# Patient Record
Sex: Female | Born: 1990 | Race: White | Hispanic: No | Marital: Married | Smoking: Never smoker
Health system: Southern US, Community
[De-identification: ages and names within clinical notes are randomized; demographics above are authoritative.]

## PROBLEM LIST (undated history)

## (undated) DIAGNOSIS — M419 Scoliosis, unspecified: Secondary | ICD-10-CM

## (undated) DIAGNOSIS — G43909 Migraine, unspecified, not intractable, without status migrainosus: Secondary | ICD-10-CM

## (undated) DIAGNOSIS — E079 Disorder of thyroid, unspecified: Secondary | ICD-10-CM

## (undated) DIAGNOSIS — F419 Anxiety disorder, unspecified: Secondary | ICD-10-CM

## (undated) HISTORY — PX: BACK SURGERY: SHX140

---

## 2019-04-02 ENCOUNTER — Emergency Department (HOSPITAL_BASED_OUTPATIENT_CLINIC_OR_DEPARTMENT_OTHER)

## 2019-04-02 ENCOUNTER — Other Ambulatory Visit: Payer: Self-pay

## 2019-04-02 ENCOUNTER — Emergency Department (HOSPITAL_BASED_OUTPATIENT_CLINIC_OR_DEPARTMENT_OTHER)
Admission: EM | Admit: 2019-04-02 | Discharge: 2019-04-02 | Disposition: A | Discharge status: Home / Self Care | Attending: Emergency Medicine | Admitting: Emergency Medicine

## 2019-04-02 ENCOUNTER — Encounter (HOSPITAL_BASED_OUTPATIENT_CLINIC_OR_DEPARTMENT_OTHER): Payer: Self-pay

## 2019-04-02 DIAGNOSIS — R002 Palpitations: Secondary | ICD-10-CM | POA: Diagnosis not present

## 2019-04-02 DIAGNOSIS — Z79899 Other long term (current) drug therapy: Secondary | ICD-10-CM | POA: Insufficient documentation

## 2019-04-02 HISTORY — DX: Anxiety disorder, unspecified: F41.9

## 2019-04-02 HISTORY — DX: Disorder of thyroid, unspecified: E07.9

## 2019-04-02 LAB — CBC WITH DIFFERENTIAL/PLATELET
Abs Immature Granulocytes: 0.01 10*3/uL (ref 0.00–0.07)
Basophils Absolute: 0 10*3/uL (ref 0.0–0.1)
Basophils Relative: 1 %
Eosinophils Absolute: 0.2 10*3/uL (ref 0.0–0.5)
Eosinophils Relative: 3 %
HCT: 43.6 % (ref 36.0–46.0)
Hemoglobin: 14.4 g/dL (ref 12.0–15.0)
Immature Granulocytes: 0 %
Lymphocytes Relative: 43 %
Lymphs Abs: 2.6 10*3/uL (ref 0.7–4.0)
MCH: 29 pg (ref 26.0–34.0)
MCHC: 33 g/dL (ref 30.0–36.0)
MCV: 87.7 fL (ref 80.0–100.0)
Monocytes Absolute: 0.5 10*3/uL (ref 0.1–1.0)
Monocytes Relative: 9 %
Neutro Abs: 2.7 10*3/uL (ref 1.7–7.7)
Neutrophils Relative %: 44 %
Platelets: 243 10*3/uL (ref 150–400)
RBC: 4.97 MIL/uL (ref 3.87–5.11)
RDW: 13.2 % (ref 11.5–15.5)
WBC: 6 10*3/uL (ref 4.0–10.5)
nRBC: 0 % (ref 0.0–0.2)

## 2019-04-02 LAB — COMPREHENSIVE METABOLIC PANEL
ALT: 20 U/L (ref 0–44)
AST: 21 U/L (ref 15–41)
Albumin: 4.3 g/dL (ref 3.5–5.0)
Alkaline Phosphatase: 59 U/L (ref 38–126)
Anion gap: 9 (ref 5–15)
BUN: 9 mg/dL (ref 6–20)
CO2: 22 mmol/L (ref 22–32)
Calcium: 9.1 mg/dL (ref 8.9–10.3)
Chloride: 107 mmol/L (ref 98–111)
Creatinine, Ser: 0.78 mg/dL (ref 0.44–1.00)
GFR calc Af Amer: >60 mL/min (ref >60)
GFR calc non Af Amer: >60 mL/min (ref >60)
Glucose, Bld: 96 mg/dL (ref 70–99)
Potassium: 3.4 mmol/L — ABNORMAL LOW (ref 3.5–5.1)
Sodium: 138 mmol/L (ref 135–145)
Total Bilirubin: 1.3 mg/dL — ABNORMAL HIGH (ref 0.3–1.2)
Total Protein: 7.4 g/dL (ref 6.5–8.1)

## 2019-04-02 LAB — URINALYSIS, ROUTINE W REFLEX MICROSCOPIC
Bilirubin Urine: NEGATIVE
Glucose, UA: NEGATIVE mg/dL
Hgb urine dipstick: NEGATIVE
Ketones, ur: 15 mg/dL — AB
Leukocytes,Ua: NEGATIVE
Nitrite: NEGATIVE
Protein, ur: NEGATIVE mg/dL
Specific Gravity, Urine: 1.02 (ref 1.005–1.030)
pH: 7 (ref 5.0–8.0)

## 2019-04-02 LAB — TSH: TSH: 2.734 u[IU]/mL (ref 0.350–4.500)

## 2019-04-02 LAB — D-DIMER, QUANTITATIVE: D-Dimer, Quant: 0.4 ug/mL-FEU (ref 0.00–0.50)

## 2019-04-02 LAB — PREGNANCY, URINE: Preg Test, Ur: NEGATIVE

## 2019-04-02 MED ORDER — LORAZEPAM 2 MG/ML IJ SOLN
1.0000 mg | Freq: Once | INTRAMUSCULAR | Status: AC
  Administered 2019-04-02: 1 mg via INTRAVENOUS
  Filled 2019-04-02: qty 1

## 2019-04-02 MED ORDER — SODIUM CHLORIDE 0.9 % IV BOLUS
1000.0000 mL | Freq: Once | INTRAVENOUS | Status: AC
  Administered 2019-04-02: 1000 mL via INTRAVENOUS

## 2019-04-02 MED ORDER — ONDANSETRON HCL 4 MG/2ML IJ SOLN
4.0000 mg | Freq: Once | INTRAMUSCULAR | Status: AC
  Administered 2019-04-02: 4 mg via INTRAVENOUS
  Filled 2019-04-02: qty 2

## 2019-04-02 MED ORDER — LORAZEPAM 1 MG PO TABS: 1.0000 mg | ORAL_TABLET | Freq: Three times a day (TID) | ORAL | 0 refills | Status: DC | PRN

## 2019-04-02 NOTE — ED Notes (Signed)
ED Provider at bedside. 

## 2019-04-02 NOTE — ED Triage Notes (Signed)
Pt c/o palpitations x 3 weeks-NAD-steady gait

## 2019-04-02 NOTE — Discharge Instructions (Signed)
Your thyroid level was normal.  Your other labs within normal limits as well.  Given your family history of arrhythmias if they should follow-up with cardiology and possibly get a Holter monitor.  I will refer you to an outpatient.  You may return for any new worsening symptoms.

## 2019-04-02 NOTE — ED Provider Notes (Signed)
[facility] EMERGENCY DEPARTMENT Provider Note   CSN: [id number] Arrival date & time: 04/02/19  1752   History Chief Complaint  Patient presents with  . Palpitations   Kristie Vasquez is a 29 y.o. female with past medical history significant for thyroid disease, anxiety who presents for evaluation of palpitations.  Patient states she has had palpitations x3 weeks.  Initially thought this was due to reflux as she started taking famotidine.  This has not resolved her symptoms.  She also thought possibly was anxiety and her PCP has increased her Effexor.  Patient states continual palpitations.  She last had her thyroid levels checked 2 months ago.  She is compliant with her medications.  She has had some mild chest tightness however she is unsure this is due to her palpitations.  She is also had had nausea whenever she goes to eat and drink.  Unsure of pregnancy.  No personal history of PE or DVT. Her leg swelling, redness, warmth from recent surgery or immobilization.  Husband did test positive for Covid 1 month ago she also had symptoms however tested negative multiple times.  She denies any cough, significant shortness of breath, hemoptysis, vomiting, diarrhea, dysuria, pelvic pain. No unilateral leg swelling, redness or warmth.  Denies additional aggravating or alleviating factors.  Does have family history significant for arrhythmia, patient's mother and grandmother does take beta-blocker.  She is unsure what arrhythmia this is.  No family history of sudden cardiac death.  No chest pain with exertion. Denies additional aggravating or alleviating factors.  She does occasionally drink a cup of coffee or tea once or twice a week however does not use chronic caffeine.  No illicit substances.  History pain from patient and past medical records.  No interpreter is used. HPI     Past Medical History:  Diagnosis Date  . Anxiety   . Thyroid disease     There are no problems to display for  this patient.   Past Surgical History:  Procedure Laterality Date  . BACK SURGERY       OB History   No obstetric history on file.     No family history on file.  Social History   Tobacco Use  . Smoking status: Never Smoker  . Smokeless tobacco: Never Used  Substance Use Topics  . Alcohol use: Yes    Comment: occ  . Drug use: Never    Home Medications Prior to Admission medications   Medication Sig Start Date End Date Taking? Authorizing Provider  levothyroxine (SYNTHROID) 75 MCG tablet Take 75 mcg by mouth daily before breakfast.   Yes [provider]  norgestimate-ethinyl estradiol (ESTARYLLA) 0.25-35 MG-MCG tablet Take 1 tablet by mouth daily.   Yes [provider]  traZODone (DESYREL) 50 MG tablet Take 50 mg by mouth at bedtime.   Yes [provider]  venlafaxine XR (EFFEXOR-XR) 150 MG 24 hr capsule Take 150 mg by mouth daily with breakfast.   Yes [provider]  ALPRAZolam (XANAX) 0.25 MG tablet Take 0.25 mg by mouth at bedtime as needed for anxiety.    [provider]  LORazepam (ATIVAN) 1 MG tablet Take 1 tablet (1 mg total) by mouth 3 (three) times daily as needed for anxiety. 04/02/19   Jordie Skalsky A, PA-C    Allergies    Keflex [cephalexin]  Review of Systems   Review of Systems  Constitutional: Positive for appetite change. Negative for activity change, chills, diaphoresis, fatigue, fever and unexpected  weight change.  HENT: Negative.   Respiratory: Positive for chest tightness. Negative for apnea, cough, choking, shortness of breath, wheezing and stridor.   Cardiovascular: Positive for palpitations. Negative for chest pain and leg swelling.  Gastrointestinal: Positive for nausea. Negative for abdominal distention, abdominal pain, anal bleeding, blood in stool, constipation, diarrhea, rectal pain and vomiting.  Genitourinary: Negative.   Musculoskeletal: Negative.   Skin: Negative.   Neurological: Negative.    All other systems reviewed and are negative.   Physical Exam Updated Vital Signs BP (!) 120/97   Pulse 89   Temp 98.2 F (36.8 C) (Oral)   Resp 15   Ht 6' (1.829 m)   Wt 79.4 kg   LMP 03/19/2019   SpO2 100%   BMI 23.73 kg/m   Physical Exam Vitals and nursing note reviewed.  Constitutional:      General: She is not in acute distress.    Appearance: She is not ill-appearing, toxic-appearing or diaphoretic.  HENT:     Head: Normocephalic and atraumatic.     Jaw: There is normal jaw occlusion.     Right Ear: Tympanic membrane, ear canal and external ear normal. There is no impacted cerumen. No hemotympanum. Tympanic membrane is not injected, scarred, perforated, erythematous, retracted or bulging.     Left Ear: Tympanic membrane, ear canal and external ear normal. There is no impacted cerumen. No hemotympanum. Tympanic membrane is not injected, scarred, perforated, erythematous, retracted or bulging.     Ears:     Comments: No Mastoid tenderness.    Nose:     Comments: Clear rhinorrhea and congestion to bilateral nares.  No sinus tenderness.    Mouth/Throat:     Comments: Posterior oropharynx clear.  Mucous membranes moist.  Tonsils without erythema or exudate.  Uvula midline without deviation.  No evidence of PTA or RPA.  No drooling, dysphasia or trismus.  Phonation normal. Neck:     Vascular: No carotid bruit or JVD.     Trachea: Trachea and phonation normal.     Meningeal: Brudzinski's sign and Kernig's sign absent.     Comments: No Neck stiffness or neck rigidity.  No meningismus.  No cervical lymphadenopathy. Cardiovascular:     Rate and Rhythm: Normal rate.     Pulses: Normal pulses.          Radial pulses are 2+ on the right side and 2+ on the left side.       Posterior tibial pulses are 2+ on the right side and 2+ on the left side.     Heart sounds: Normal heart sounds.     Comments: No murmurs rubs or gallops. Pulmonary:     Effort: Pulmonary effort is normal.       Breath sounds: Normal breath sounds and air entry.     Comments: Clear to auscultation bilaterally without wheeze, rhonchi or rales.  No accessory muscle usage.  Able speak in full sentences. Abdominal:     Comments: Soft, nontender without rebound or guarding.  No CVA tenderness.  Musculoskeletal:        General: Normal range of motion.     Cervical back: Full passive range of motion without pain, normal range of motion and neck supple.     Comments: Moves all 4 extremities without difficulty.  Lower extremities without edema, erythema or warmth.  Skin:    General: Skin is warm and dry.     Capillary Refill: Capillary refill takes less than 2 seconds.  Comments: Brisk capillary refill.  No rashes or lesions.  Neurological:     Mental Status: She is alert.     Comments: Ambulatory in department without difficulty.  Cranial nerves II through XII grossly intact.  No facial droop.  No aphasia.    ED Results / Procedures / Treatments   Labs (all labs ordered are listed, but only abnormal results are displayed) Labs Reviewed  COMPREHENSIVE METABOLIC PANEL - Abnormal; Notable for the following components:      Result Value   Potassium 3.4 (*)    Total Bilirubin 1.3 (*)    All other components within normal limits  URINALYSIS, ROUTINE W REFLEX MICROSCOPIC - Abnormal; Notable for the following components:   Ketones, ur 15 (*)    All other components within normal limits  CBC WITH DIFFERENTIAL/PLATELET  PREGNANCY, URINE  D-DIMER, QUANTITATIVE (NOT AT [facility])  TSH    EKG EKG Interpretation  Date/Time:  Monday April 02 2019 18:17:41 EST Ventricular Rate:  104 PR Interval:  128 QRS Duration: 82 QT Interval:  328 QTC Calculation: 432 R Axis:   90 Text Interpretation: Sinus tachycardia Biatrial enlargement Borderline right axis deviation Borderline T wave abnormalities with decreased rate, normalization of t waves. Confirmed by Arby Barrette [phone]) on 04/02/2019 6:22:09  PM   Radiology DG Chest 2 View  Result Date: 04/02/2019 CLINICAL DATA:  Palpitations and chest pain EXAM: CHEST - 2 VIEW COMPARISON:  None. FINDINGS: The heart size and mediastinal contours are within normal limits. Both lungs are clear. The visualized skeletal structures are unremarkable. Bilateral long segment thoracolumbar spinal rods. IMPRESSION: No active cardiopulmonary disease. Electronically Signed   By: Deatra Robinson M.D.   On: 04/02/2019 21:04    Procedures Procedures (including critical care time)  Medications Ordered in ED Medications  sodium chloride 0.9 % bolus 1,000 mL (0 mLs Intravenous Stopped 04/02/19 2010)  ondansetron (ZOFRAN) injection 4 mg (4 mg Intravenous Given 04/02/19 1906)  LORazepam (ATIVAN) injection 1 mg (1 mg Intravenous Given 04/02/19 1905)   ED Course  I have reviewed the triage vital signs and the nursing notes.  Pertinent labs & imaging results that were available during my care of the patient were reviewed by me and considered in my medical decision making (see chart for details).  29 year old female peers otherwise well presents for evaluation of palpitations.  Palpitations intermittent over the last 3 weeks.  History of thyroid disease is compliant with her medications.  Does have history history of anxiety.  Recently went up on her Effexor.  No SI, HI, AVH.  Has also had some nausea.  Does have family history of arrhythmias in which her mother and grandmother are both on beta-blockers.  She is unsure what arrhythmia this is.  No family history of sudden cardiac death.  No evidence of DVT on exam.  Heart and lungs clear.  No carotid bruits.  No unilateral leg swelling, redness or warmth.  She does get some chest tightness when she has her palpitations.  On arrival she was tachycardic to 120.  She did appear anxious.  No facial, ocular edema, proptosis. No pretibial edema on exam.  Labs and imaging personally reviewed and interpreted. CBC without  leukocytosis, hemoglobin 14.4 D-dimer within normal limits Pregnancy test negative Urinalysis negative for infection Metabolic panel with mild hypokalemia 3.4 however no additional joint, renal or liver normality TSH within normal limits Chest x-ray without acute cardiomegaly, pulmonary edema, pneumothorax, infiltrates EKG with sinus tachycardia. NO STEM  Patient was  given Ativan on arrival for some anxiety.  Her heart rate came down nicely into the 80s.  She remained without arrhythmia while she was here in the emergency department.  No further episodes of tachycardia.  Feel likely patient's tachycardia and palpitations may be multifactorial however she does have significant family history for arrhythmias.  She does not have PCP here in [place] as she is here for school.  The patient would likely benefit from Holter monitor and further evaluation given family history.  Patient can follow-up outpatient return to emergency department for new or worsening symptoms.  The patient has been appropriately medically screened and/or stabilized in the ED. I have low suspicion for any other emergent medical condition which would require further screening, evaluation or treatment in the ED or require inpatient management.  Patient is hemodynamically stable and in no acute distress.  Patient able to ambulate in department prior to ED.  Evaluation does not show acute pathology that would require ongoing or additional emergent interventions while in the emergency department or further inpatient treatment.  I have discussed the diagnosis with the patient and answered all questions.  Pain is been managed while in the emergency department and patient has no further complaints prior to discharge.  Patient is comfortable with plan discussed in room and is stable for discharge at this time.  I have discussed strict return precautions for returning to the emergency department.  Patient was encouraged to follow-up with  PCP/specialist refer to at discharge.     MDM Rules/Calculators/A&P                       Final Clinical Impression(s) / ED Diagnoses Final diagnoses:  Palpitations    Rx / DC Orders ED Discharge Orders         Ordered    LORazepam (ATIVAN) 1 MG tablet  3 times daily PRN     04/02/19 2229           Pearla Mckinny A, PA-C 04/02/19 2257    Arby Barrette, MD 04/11/19 [phone]

## 2019-04-05 ENCOUNTER — Ambulatory Visit: Admitting: Cardiovascular Disease

## 2019-04-05 NOTE — Progress Notes (Deleted)
Cardiology Office Note   Date:  04/05/2019   ID:  Kristie Vasquez, DOB 10-Dec-1990, MRN [id number]  PCP:  Patient, No Pcp Per  Cardiologist:   Cris Talavera [place], MD   No chief complaint on file.     History of Present Illness: Kristie Vasquez is a 29 y.o. female who presents for evaluation of palpitations. She has a history of hypothyroidism and anxiety.    Past Medical History:  Diagnosis Date  . Anxiety   . Thyroid disease     Past Surgical History:  Procedure Laterality Date  . BACK SURGERY       Current Outpatient Medications  Medication Sig Dispense Refill  . ALPRAZolam (XANAX) 0.25 MG tablet Take 0.25 mg by mouth at bedtime as needed for anxiety.    Marland Kitchen levothyroxine (SYNTHROID) 75 MCG tablet Take 75 mcg by mouth daily before breakfast.    . LORazepam (ATIVAN) 1 MG tablet Take 1 tablet (1 mg total) by mouth 3 (three) times daily as needed for anxiety. 15 tablet 0  . norgestimate-ethinyl estradiol (ESTARYLLA) 0.25-35 MG-MCG tablet Take 1 tablet by mouth daily.    . traZODone (DESYREL) 50 MG tablet Take 50 mg by mouth at bedtime.    Marland Kitchen venlafaxine XR (EFFEXOR-XR) 150 MG 24 hr capsule Take 150 mg by mouth daily with breakfast.     No current facility-administered medications for this visit.    Allergies:   Keflex [cephalexin]    Social History:  The patient  reports that she has never smoked. She has never used smokeless tobacco. She reports current alcohol use. She reports that she does not use drugs.   Family History:  The patient's ***family history is not on file.    ROS:  Please see the history of present illness.   Otherwise, review of systems are positive for {NONE DEFAULTED:18576::"none"}.   All other systems are reviewed and negative.    PHYSICAL EXAM: VS:  LMP 03/19/2019  , BMI There is no height or weight on file to calculate BMI. GEN: Well nourished, well developed, in no acute distress  HEENT: normal  Neck: no JVD, carotid bruits, or masses Cardiac:  ***RRR; no murmurs, rubs, or gallops,no edema  Respiratory:  clear to auscultation bilaterally, normal work of breathing GI: soft, nontender, nondistended, + BS MS: no deformity or atrophy  Skin: warm and dry, no rash Neuro:  Strength and sensation are intact Psych: euthymic mood, full affect   EKG:  EKG {ACTION; IS/IS AYT:01601093} ordered today. The ekg ordered today demonstrates *** Ecg dated 04/02/19 showed sinus tachycardia rate 104. Right axis. Mild nonspecific TWA. I have personally reviewed and interpreted this study.   Recent Labs: 04/02/2019: ALT 20; BUN 9; Creatinine, Ser 0.78; Hemoglobin 14.4; Platelets 243; Potassium 3.4; Sodium 138; TSH 2.734    Lipid Panel No results found for: CHOL, TRIG, HDL, CHOLHDL, VLDL, LDLCALC, LDLDIRECT    Wt Readings from Last 3 Encounters:  04/02/19 175 lb (79.4 kg)      Other studies Reviewed: Additional studies/ records that were reviewed today include:  Labs dated 02/14/19: Normal CMET and CBC.  Dated 02/06/19: cholesterol 189, triglycerides 104, HDL 49, LDL 119. TSH 2.18, free T4 1.3   ASSESSMENT AND PLAN:  1.  ***   Current medicines are reviewed at length with the patient today.  The patient {ACTIONS; HAS/DOES NOT HAVE:19233} concerns regarding medicines.  The following changes have been made:  {PLAN; NO CHANGE:13088:s}  Labs/ tests ordered today include: *** No  orders of the defined types were placed in this encounter.    Disposition:   FU with *** in {gen number [id number]} {Days to years:10300}  Signed, Terrilee Dudzik [place], MD  04/05/2019 7:15 AM    [facility] [address] [place], [place], [address] Phone [phone], Fax [phone]

## 2019-04-06 ENCOUNTER — Ambulatory Visit: Admitting: Cardiology

## 2019-04-08 NOTE — Progress Notes (Signed)
Cardiology Office Note:   Date:  04/09/2019  NAME:  Kristie Vasquez    MRN: [id number] DOB:  08-03-1990   PCP:  Patient, No Pcp Per  Cardiologist:  No primary care provider on file.   Referring MD: No ref. provider found   Chief Complaint  Patient presents with  . Palpitations   History of Present Illness:   Kristie Vasquez is a 29 y.o. female with a hx of hypothyroidism who is being seen today for the evaluation of palpitations.  Evaluated emergency room on 04/02/2019.  Thyroid studies normal.  Chest x-ray clear.  EKG demonstrated sinus tachycardia, heart rate 104.  She reports about 1 month ago she started to have daily episodes of palpitations.  They occur every day.  They can last minutes to hours.  Is described as sensation of rapid heartbeat and pounding sensation in her chest.  They are worse with exertion.  Associated symptoms include chest tightness.  They seem to just come and go and resolve without any identifiable alleviating factor.  She was seen in the emergency room as mentioned above.  Work-up was negative.  She also reports significant stressors in her life.  She is recently been separated as of last week.  Appears this is a mutual plan for divorce.  There is no issues with safety in the house.  She reports she has not been physically abused.  EKG today demonstrates heart rate 92.  During my examination she reports that she feels her heart is racing and pounding.  Of note the heart rate was in the 90s.  She reports that she has cut out most caffeine consumption.  She is not drinking excess caffeinated beverages.  She does not smoke or use illicit drugs.  She does report a long history of anxiety and depression.  She is on Effexor as she has been weaned off Paxil recently.  She was given Ativan in the emergency room.  She reports she is just extremely stressed with her recent separation as well as her overall sensation of her heart racing.  Thyroid studies were normal in the emergency room.   She is currently a Art gallery manager at [employer].  She will graduate in 2-1/2 months.  She has plans to move back to [place].  Past Medical History: Past Medical History:  Diagnosis Date  . Anxiety   . Thyroid disease    Past Surgical History: Past Surgical History:  Procedure Laterality Date  . BACK SURGERY      Current Medications: Current Meds  Medication Sig  . albuterol (VENTOLIN HFA) 108 (90 Base) MCG/ACT inhaler Inhale into the lungs.  . Calcium Carbonate-Vitamin D 600-400 MG-UNIT tablet Take by mouth.  . levothyroxine (SYNTHROID) 75 MCG tablet Take 75 mcg by mouth daily before breakfast.  . LORazepam (ATIVAN) 0.5 MG tablet Take 0.5 mg by mouth daily as needed.  Marland Kitchen LORazepam (ATIVAN) 1 MG tablet Take 1 tablet (1 mg total) by mouth 3 (three) times daily as needed for anxiety.  . norgestimate-ethinyl estradiol (ESTARYLLA) 0.25-35 MG-MCG tablet Take 1 tablet by mouth daily.  . SUMAtriptan (IMITREX) 100 MG tablet Take by mouth.  . traZODone (DESYREL) 50 MG tablet Take 50 mg by mouth at bedtime.  . traZODone (DESYREL) 50 MG tablet Take 50 mg by mouth as needed.  . venlafaxine XR (EFFEXOR-XR) 150 MG 24 hr capsule Take 150 mg by mouth daily with breakfast.     Allergies:    Keflex [cephalexin]   Social History:  Social History   Socioeconomic History  . Marital status: Married    Spouse name: Not on file  . Number of children: Not on file  . Years of education: Not on file  . Highest education level: Not on file  Occupational History  . Not on file  Tobacco Use  . Smoking status: Never Smoker  . Smokeless tobacco: Never Used  Substance and Sexual Activity  . Alcohol use: Not Currently    Comment: occ  . Drug use: Never  . Sexual activity: Not on file  Other Topics Concern  . Not on file  Social History Narrative  . Not on file   Social Determinants of Health   Financial Resource Strain:   . Difficulty of Paying Living Expenses: Not on file  Food  Insecurity:   . Worried About Programme researcher, broadcasting/film/video in the Last Year: Not on file  . Ran Out of Food in the Last Year: Not on file  Transportation Needs:   . Lack of Transportation (Medical): Not on file  . Lack of Transportation (Non-Medical): Not on file  Physical Activity:   . Days of Exercise per Week: Not on file  . Minutes of Exercise per Session: Not on file  Stress:   . Feeling of Stress : Not on file  Social Connections:   . Frequency of Communication with Friends and Family: Not on file  . Frequency of Social Gatherings with Friends and Family: Not on file  . Attends Religious Services: Not on file  . Active Member of Clubs or Organizations: Not on file  . Attends Banker Meetings: Not on file  . Marital Status: Not on file     Family History: The patient's family history includes Arrhythmia in her maternal grandmother and mother.  ROS:   All other ROS reviewed and negative. Pertinent positives noted in the HPI.     EKGs/Labs/Other Studies Reviewed:   The following studies were personally reviewed by me today:  EKG:  EKG is ordered today.  The ekg ordered today demonstrates normal sinus rhythm, heart rate 92, and no acute ischemic changes, no evidence of prior infarct, and was personally reviewed by me.   Recent Labs: 04/02/2019: ALT 20; BUN 9; Creatinine, Ser 0.78; Hemoglobin 14.4; Platelets 243; Potassium 3.4; Sodium 138; TSH 2.734   Recent Lipid Panel No results found for: CHOL, TRIG, HDL, CHOLHDL, VLDL, LDLCALC, LDLDIRECT  Physical Exam:   VS:  Temp 98.4 F (36.9 C)   Ht 6' (1.829 m)   Wt 173 lb 6.4 oz (78.7 kg)   LMP 03/19/2019   SpO2 98%   BMI 23.52 kg/m    Wt Readings from Last 3 Encounters:  04/09/19 173 lb 6.4 oz (78.7 kg)  04/02/19 175 lb (79.4 kg)    General: Well nourished, well developed, anxious appearing Heart: Atraumatic, normal size  Eyes: PEERLA, EOMI  Neck: Supple, no JVD Endocrine: No thryomegaly Cardiac: Normal S1, S2;  RRR; no murmurs, rubs, or gallops Lungs: Clear to auscultation bilaterally, no wheezing, rhonchi or rales  Abd: Soft, nontender, no hepatomegaly  Ext: No edema, pulses 2+ Musculoskeletal: No deformities, BUE and BLE strength normal and equal Skin: Warm and dry, no rashes   Neuro: Alert and oriented to person, place, time, and situation, CNII-XII grossly intact, no focal deficits  Psych: Normal mood and affect   ASSESSMENT:   Sandeep Delagarza is a 29 y.o. female who presents for the following: 1. Palpitations   2. Nonspecific  abnormal electrocardiogram (ECG) (EKG)     PLAN:   1. Palpitations 2. Nonspecific abnormal electrocardiogram (ECG) (EKG) -Episodes of palpitations.  Occurring for 1 month.  Associated with significant anxiety and stress.  Also going through a likely divorce.  Appears to be safe in the home.  EKG with normal sinus rhythm heart rate 92.  She does report symptoms of pounding in her chest during her examination.  Her heart rate is in the 90s.  She appears tearful and stressed on examination. Thyroid studies normal. I suspect this is all anxiety/stress related. I have advised against beta blocker at this time. No murmurs or abnormality on today's examination.  We will go ahead and pursue a 3-day Zio patch to exclude any significant cardiac arrhythmias.  Again, I highly suspect this is all stress related.  She will follow-up with her psychiatrist tomorrow to determine if they can help with any medication changes.  We will plan to follow-up in 1 month for a virtual appointment to discuss results and symptoms.   Disposition: Return in about 1 month (around 05/07/2019).  Medication Adjustments/Labs and Tests Ordered: Current medicines are reviewed at length with the patient today.  Concerns regarding medicines are outlined above.  Orders Placed This Encounter  Procedures  . LONG TERM MONITOR (3-14 DAYS)  . EKG 12-Lead   No orders of the defined types were placed in this  encounter.   Patient Instructions  Medication Instructions:  The current medical regimen is effective;  continue present plan and medications.  *If you need a refill on your cardiac medications before your next appointment, please call your pharmacy*  Testing/Procedures: Your physician has recommended that you wear a 3 DAY ZIO-PATCH monitor. The Zio patch cardiac monitor continuously records heart rhythm data for up to 14 days, this is for patients being evaluated for multiple types heart rhythms. For the first 24 hours post application, please avoid getting the Zio monitor wet in the shower or by excessive sweating during exercise. After that, feel free to carry on with regular activities. Keep soaps and lotions away from the ZIO XT Patch.  This will be mailed to you, please expect 7-10 days to receive.        Follow-Up: At [facility], you and your health needs are our priority.  As part of our continuing mission to provide you with exceptional heart care, we have created designated Provider Care Teams.  These Care Teams include your primary Cardiologist (physician) and Advanced Practice Providers (APPs -  Physician Assistants and Nurse Practitioners) who all work together to provide you with the care you need, when you need it.  Your next appointment:   1 month(s)  The format for your next appointment:   Virtual Visit   Provider:   Lennie Odor, MD        Signed, Lenna Gilford. Flora Lipps, MD [facility]  [address], [address] [place], [place] [address] [phone]  04/09/2019 2:52 PM

## 2019-04-09 ENCOUNTER — Other Ambulatory Visit: Payer: Self-pay

## 2019-04-09 ENCOUNTER — Encounter: Payer: Self-pay | Admitting: Cardiovascular Disease

## 2019-04-09 ENCOUNTER — Ambulatory Visit (INDEPENDENT_AMBULATORY_CARE_PROVIDER_SITE_OTHER): Admitting: Cardiovascular Disease

## 2019-04-09 VITALS — BP 140/80 | Temp 98.4°F | Ht 72.0 in | Wt 173.4 lb

## 2019-04-09 DIAGNOSIS — R9431 Abnormal electrocardiogram [ECG] [EKG]: Secondary | ICD-10-CM

## 2019-04-09 DIAGNOSIS — R002 Palpitations: Secondary | ICD-10-CM

## 2019-04-09 NOTE — Patient Instructions (Signed)
Medication Instructions:  The current medical regimen is effective;  continue present plan and medications.  *If you need a refill on your cardiac medications before your next appointment, please call your pharmacy*  Testing/Procedures: Your physician has recommended that you wear a 3 DAY ZIO-PATCH monitor. The Zio patch cardiac monitor continuously records heart rhythm data for up to 14 days, this is for patients being evaluated for multiple types heart rhythms. For the first 24 hours post application, please avoid getting the Zio monitor wet in the shower or by excessive sweating during exercise. After that, feel free to carry on with regular activities. Keep soaps and lotions away from the ZIO XT Patch.  This will be mailed to you, please expect 7-10 days to receive.        Follow-Up: At [facility], you and your health needs are our priority.  As part of our continuing mission to provide you with exceptional heart care, we have created designated Provider Care Teams.  These Care Teams include your primary Cardiologist (physician) and Advanced Practice Providers (APPs -  Physician Assistants and Nurse Practitioners) who all work together to provide you with the care you need, when you need it.  Your next appointment:   1 month(s)  The format for your next appointment:   Virtual Visit   Provider:   Lennie Odor, MD

## 2019-04-11 ENCOUNTER — Telehealth: Payer: Self-pay | Admitting: Radiology

## 2019-04-11 NOTE — Telephone Encounter (Signed)
Enrolled patient for a 3 day Zio monitor to be mailed to patients home.  

## 2019-04-14 ENCOUNTER — Other Ambulatory Visit (INDEPENDENT_AMBULATORY_CARE_PROVIDER_SITE_OTHER)

## 2019-04-14 DIAGNOSIS — R002 Palpitations: Secondary | ICD-10-CM | POA: Diagnosis not present

## 2019-05-06 NOTE — Progress Notes (Signed)
Virtual Visit via Telephone Note   This visit type was conducted due to national recommendations for restrictions regarding the COVID-19 Pandemic (e.g. social distancing) in an effort to limit this patient's exposure and mitigate transmission in our community.  Due to her co-morbid illnesses, this patient is at least at moderate risk for complications without adequate follow up.  This format is felt to be most appropriate for this patient at this time.  The patient did not have access to video technology/had technical difficulties with video requiring transitioning to audio format only (telephone).  All issues noted in this document were discussed and addressed.  No physical exam could be performed with this format.  Please refer to the patient's chart for her  consent to telehealth for [facility].   The patient was identified using 2 identifiers.  Date:  05/08/2019   ID:  Orbie Pyo, DOB 1990/05/04, MRN [id number]  Patient Location: Home Provider Location: Office  PCP:  Patient, No Pcp Per  Cardiologist:  No primary care provider on file.   Evaluation Performed:  Follow-Up Visit  Chief Complaint:  Palpitations   History of Present Illness:    Kristie Vasquez is a 29 y.o. female with anxiety and hypothyroidism who presents for follow-up of CP and palpitations. Recent monitor without significant arrhythmia. Going through divorce when I saw her last and in pharmacy school. Having 3-4 episodes of palpitations per week. Can last seconds to hours. Symptoms have improved since our last visit. She has recently moved out from the residence of her and her husband. She has gotten an apartment on her own. Denies CP or SOB. Does not exercise formally but climbs stairs at her apartment without limitations. Symptoms overall are improving since our last visit. Working with therapist and psychiatrist on anxiety/depression. On lexapro and taking xanax as needed. Being cautious with xanax. I discussed  recent monitor that was normal. She is reassured by this.   The patient does not have symptoms concerning for COVID-19 infection (fever, chills, cough, or new shortness of breath).   Past Medical History:  Diagnosis Date  . Anxiety   . Thyroid disease    Past Surgical History:  Procedure Laterality Date  . BACK SURGERY       Current Meds  Medication Sig  . albuterol (VENTOLIN HFA) 108 (90 Base) MCG/ACT inhaler Inhale into the lungs.  . ALPRAZolam (XANAX) 0.25 MG tablet Take 0.25 mg by mouth at bedtime as needed for anxiety.  Marland Kitchen escitalopram (LEXAPRO) 20 MG tablet Take 20 mg by mouth daily.  Marland Kitchen levothyroxine (SYNTHROID) 75 MCG tablet Take 75 mcg by mouth daily before breakfast.  . norgestimate-ethinyl estradiol (ESTARYLLA) 0.25-35 MG-MCG tablet Take 1 tablet by mouth daily.  . SUMAtriptan (IMITREX) 100 MG tablet Take by mouth.  . traZODone (DESYREL) 50 MG tablet Take 50 mg by mouth as needed.     Allergies:   Keflex [cephalexin]   Social History   Tobacco Use  . Smoking status: Never Smoker  . Smokeless tobacco: Never Used  Substance Use Topics  . Alcohol use: Not Currently    Comment: occ  . Drug use: Never    Family Hx: The patient's family history includes Arrhythmia in her maternal grandmother and mother.  ROS:   Please see the history of present illness.     All other systems reviewed and are negative.  Prior CV studies:   The following studies were reviewed today:  Zio 04/26/2019 Impression:  1. No arrhythmias detected.  2. Rare ectopy.   Labs/Other Tests and Data Reviewed:    EKG:  No ECG reviewed.  Recent Labs: 04/02/2019: ALT 20; BUN 9; Creatinine, Ser 0.78; Hemoglobin 14.4; Platelets 243; Potassium 3.4; Sodium 138; TSH 2.734   Recent Lipid Panel No results found for: CHOL, TRIG, HDL, CHOLHDL, LDLCALC, LDLDIRECT  Wt Readings from Last 3 Encounters:  04/09/19 173 lb 6.4 oz (78.7 kg)  04/02/19 175 lb (79.4 kg)     Objective:    Vital Signs:   Ht 6' (1.829 m)   BMI 23.52 kg/m    VITAL SIGNS:  reviewed  Gen: NAD Pulm: talking full sentences, no SOB Psych: normal mood/affect   ASSESSMENT & PLAN:    1. Palpitations 2. Chest pain, unspecified type -normal zio. Normal TSH. EKG unremarkable. Symptoms related to stress/anxiety. Symptoms are improving with treatment of anxiety/depression. She will see [place] as needed.    COVID-19 Education: The signs and symptoms of COVID-19 were discussed with the patient and how to seek care for testing (follow up with PCP or arrange E-visit). The importance of social distancing was discussed today.  Time:   Today, I have spent 25 minutes with the patient with telehealth technology discussing the above problems.     Medication Adjustments/Labs and Tests Ordered: Current medicines are reviewed at length with the patient today.  Concerns regarding medicines are outlined above.   Tests Ordered: No orders of the defined types were placed in this encounter.   Medication Changes: No orders of the defined types were placed in this encounter.   Follow Up: PRN   Signed, Reatha Harps, MD  05/08/2019 8:56 AM     [facility]

## 2019-05-07 ENCOUNTER — Ambulatory Visit: Admitting: Cardiology

## 2019-05-08 ENCOUNTER — Telehealth (INDEPENDENT_AMBULATORY_CARE_PROVIDER_SITE_OTHER): Admitting: Cardiovascular Disease

## 2019-05-08 ENCOUNTER — Encounter: Payer: Self-pay | Admitting: Cardiovascular Disease

## 2019-05-08 VITALS — Ht 72.0 in

## 2019-05-08 DIAGNOSIS — R002 Palpitations: Secondary | ICD-10-CM | POA: Diagnosis not present

## 2019-05-08 DIAGNOSIS — R079 Chest pain, unspecified: Secondary | ICD-10-CM | POA: Diagnosis not present

## 2019-05-08 NOTE — Patient Instructions (Signed)
Medication Instructions:  The current medical regimen is effective;  continue present plan and medications.  *If you need a refill on your cardiac medications before your next appointment, please call your pharmacy*   Follow-Up: At [facility], you and your health needs are our priority.  As part of our continuing mission to provide you with exceptional heart care, we have created designated Provider Care Teams.  These Care Teams include your primary Cardiologist (physician) and Advanced Practice Providers (APPs -  Physician Assistants and Nurse Practitioners) who all work together to provide you with the care you need, when you need it.  We recommend signing up for the patient portal called "MyChart".  Sign up information is provided on this After Visit Summary.  MyChart is used to connect with patients for Virtual Visits (Telemedicine).  Patients are able to view lab/test results, encounter notes, upcoming appointments, etc.  Non-urgent messages can be sent to your provider as well.   To learn more about what you can do with MyChart, go to [email or web address].    Your next appointment:   PRN  The format for your next appointment:   Either In Person or Virtual  Provider:   Lennie Odor, MD

## 2019-09-15 ENCOUNTER — Other Ambulatory Visit: Payer: Self-pay

## 2019-09-15 ENCOUNTER — Encounter (HOSPITAL_BASED_OUTPATIENT_CLINIC_OR_DEPARTMENT_OTHER): Payer: Self-pay

## 2019-09-15 ENCOUNTER — Emergency Department (HOSPITAL_BASED_OUTPATIENT_CLINIC_OR_DEPARTMENT_OTHER)

## 2019-09-15 ENCOUNTER — Emergency Department (HOSPITAL_BASED_OUTPATIENT_CLINIC_OR_DEPARTMENT_OTHER)
Admission: EM | Admit: 2019-09-15 | Discharge: 2019-09-16 | Disposition: A | Discharge status: Home / Self Care | Attending: Emergency Medicine | Admitting: Emergency Medicine

## 2019-09-15 DIAGNOSIS — M25561 Pain in right knee: Secondary | ICD-10-CM | POA: Insufficient documentation

## 2019-09-15 DIAGNOSIS — M25461 Effusion, right knee: Secondary | ICD-10-CM | POA: Diagnosis not present

## 2019-09-15 DIAGNOSIS — Z79899 Other long term (current) drug therapy: Secondary | ICD-10-CM | POA: Insufficient documentation

## 2019-09-15 HISTORY — DX: Migraine, unspecified, not intractable, without status migrainosus: G43.909

## 2019-09-15 HISTORY — DX: Scoliosis, unspecified: M41.9

## 2019-09-15 NOTE — ED Triage Notes (Signed)
Pt reports injuring her R knee 2-3 months ago. Pain was under control and then last 2 days pt had significant increase in pain with radiation down to heel. Pt reports swelling to knee.

## 2019-09-16 NOTE — ED Provider Notes (Signed)
[facility] EMERGENCY DEPARTMENT Provider Note   CSN: [id number] Arrival date & time: 09/15/19  1842   History Chief Complaint  Patient presents with  . Knee Pain    Kristie Vasquez is a 29 y.o. female.  The history is provided by the patient.  Knee Pain She has no significant past history and comes in complaining of pain in her right knee.  She had injured the right knee about 2 months ago when she bent down and felt it pop.  She thinks the kneecap may have come out of place and popped back.  Since then, she has been having ongoing pain in the right knee.  She has applied ice and heat.  She has taken over-the-counter ibuprofen and acetaminophen with little relief.  She has been wearing a knee brace which also does not seem to be helping.  There is no history of prior injury to that knee.  Past Medical History:  Diagnosis Date  . Anxiety   . Migraines   . Scoliosis   . Thyroid disease     There are no problems to display for this patient.   Past Surgical History:  Procedure Laterality Date  . BACK SURGERY       OB History   No obstetric history on file.     Family History  Problem Relation Age of Onset  . Arrhythmia Mother   . Arrhythmia Maternal Grandmother     Social History   Tobacco Use  . Smoking status: Never Smoker  . Smokeless tobacco: Never Used  Vaping Use  . Vaping Use: Never used  Substance Use Topics  . Alcohol use: Yes    Comment: occ  . Drug use: Never    Home Medications Prior to Admission medications   Medication Sig Start Date End Date Taking? Authorizing Provider  albuterol (VENTOLIN HFA) 108 (90 Base) MCG/ACT inhaler Inhale into the lungs. 03/25/17   [provider]  ALPRAZolam Prudy Feeler) 0.25 MG tablet Take 0.25 mg by mouth at bedtime as needed for anxiety.    [provider]  Calcium Carbonate-Vitamin D 600-400 MG-UNIT tablet Take by mouth.    [provider]  escitalopram (LEXAPRO) 20 MG tablet Take  20 mg by mouth daily. 05/04/19   [provider]  levothyroxine (SYNTHROID) 75 MCG tablet Take 75 mcg by mouth daily before breakfast.    [provider]  norgestimate-ethinyl estradiol (ESTARYLLA) 0.25-35 MG-MCG tablet Take 1 tablet by mouth daily.    [provider]  SUMAtriptan (IMITREX) 100 MG tablet Take by mouth. 01/11/18   [provider]  traZODone (DESYREL) 50 MG tablet Take 50 mg by mouth as needed. 12/26/18   [provider]    Allergies    Keflex [cephalexin]  Review of Systems   Review of Systems  All other systems reviewed and are negative.   Physical Exam Updated Vital Signs BP (!) 110/88 (BP Location: Right Arm)   Pulse 52   Temp 98.4 F (36.9 C) (Oral)   Resp 20   Ht 6' (1.829 m)   Wt 70.3 kg   SpO2 98%   BMI 21.02 kg/m   Physical Exam Vitals and nursing note reviewed.   29 year old female, resting comfortably and in no acute distress. Vital signs are normal. Oxygen saturation is 98%, which is normal. Head is normocephalic and atraumatic. PERRLA, EOMI. Oropharynx is clear. Neck is nontender and supple without adenopathy or JVD. Back is nontender and there is no  CVA tenderness. Lungs are clear without rales, wheezes, or rhonchi. Chest is nontender. Heart has regular rate and rhythm without murmur. Abdomen is soft, flat, nontender without masses or hepatosplenomegaly and peristalsis is normoactive. Extremities: There is a small effusion present in the right knee.  There is tenderness palpation over the lateral joint line.  There is no instability on valgus or varus stress.  Lachman and McMurray's tests are negative. Skin is warm and dry without rash. Neurologic: Mental status is normal, cranial nerves are intact, there are no motor or sensory deficits.  ED Results / Procedures / Treatments    Radiology DG Knee Complete 4 Views Right  Result Date: 09/15/2019 CLINICAL DATA:  Popping sensation 2 months ago with  persistent pain and swelling, initial encounter EXAM: RIGHT KNEE - COMPLETE 4+ VIEW COMPARISON:  None. FINDINGS: No evidence of fracture, dislocation, or joint effusion. No evidence of arthropathy or other focal bone abnormality. Soft tissues are unremarkable. IMPRESSION: No acute abnormality is noted. Electronically Signed   By: Alcide Clever M.D.   On: 09/15/2019 19:47    Procedures Procedures  Medications Ordered in ED Medications - No data to display  ED Course  I have reviewed the triage vital signs and the nursing notes.  Pertinent imaging results that were available during my care of the patient were reviewed by me and considered in my medical decision making (see chart for details).  MDM Rules/Calculators/A&P Right knee pain with effusion.  X-rays showed no acute process.  She will be referred to sports medicine for follow-up.  Old records are reviewed, and she has no relevant past visits.  Final Clinical Impression(s) / ED Diagnoses Final diagnoses:  Right knee pain, unspecified chronicity  Effusion of right knee    Rx / DC Orders ED Discharge Orders    None       Dione Booze, MD 09/16/19 [phone]

## 2019-09-16 NOTE — Discharge Instructions (Signed)
Apply ice as needed.  Continue to wear your knee brace as needed.  Take two naproxen at a time twice a day. Take acetaminophen as needed.

## 2019-09-24 ENCOUNTER — Other Ambulatory Visit: Payer: Self-pay

## 2019-09-24 ENCOUNTER — Ambulatory Visit: Payer: Self-pay

## 2019-09-24 ENCOUNTER — Encounter: Payer: Self-pay | Admitting: Family Medicine

## 2019-09-24 ENCOUNTER — Ambulatory Visit (INDEPENDENT_AMBULATORY_CARE_PROVIDER_SITE_OTHER): Admitting: Family Medicine

## 2019-09-24 VITALS — BP 112/81 | HR 96 | Ht 72.0 in | Wt 155.0 lb

## 2019-09-24 DIAGNOSIS — M357 Hypermobility syndrome: Secondary | ICD-10-CM | POA: Diagnosis not present

## 2019-09-24 DIAGNOSIS — S83001A Unspecified subluxation of right patella, initial encounter: Secondary | ICD-10-CM | POA: Diagnosis not present

## 2019-09-24 NOTE — Patient Instructions (Signed)
Nice to meet you Please try ice  Please try the exercises   Please try ibuprofen as needed  Please send me a message in MyChart with any questions or updates.  Please see me back in 4 weeks.   --Dr. Jordan Likes

## 2019-09-24 NOTE — Assessment & Plan Note (Signed)
Pain likely associated with subluxation.  Has hypermobility that is likely contributing to her symptoms.  No structural changes appreciated on ultrasound or x-ray. -Counseled on home exercise therapy and supportive care. -Tru-Pull lite brace. -Provided work note. -Could consider physical therapy or MRI.

## 2019-09-24 NOTE — Assessment & Plan Note (Signed)
Beighton score of 8/9.  Has a history of scoliosis with fusion of the spine when she was 29 years old.

## 2019-09-24 NOTE — Progress Notes (Signed)
Kristie Vasquez - 29 y.o. female MRN [id number]  Date of birth: 1990-07-06  SUBJECTIVE:  Including CC & ROS.  Chief Complaint  Patient presents with  . Knee Pain    right    Kristie Vasquez is a 29 y.o. female that is presenting with right knee pain.  Has been ongoing for a few months.  She felt like the kneecap moved out of place and went back in.  This was occurring when she was going from sitting to standing.  No history of subluxation previously.  Pain feels deep and worse at the end of the day.  Has tried a over-the-counter brace.  Has been taking medications with limited improvement..  Independent review of the right knee x-ray from 7/31 shows no acute abnormalities.   Review of Systems See HPI   HISTORY: Past Medical, Surgical, Social, and Family History Reviewed & Updated per EMR.   Pertinent Historical Findings include:  Past Medical History:  Diagnosis Date  . Anxiety   . Migraines   . Scoliosis   . Thyroid disease     Past Surgical History:  Procedure Laterality Date  . BACK SURGERY      Family History  Problem Relation Age of Onset  . Arrhythmia Mother   . Arrhythmia Maternal Grandmother     Social History   Socioeconomic History  . Marital status: Married    Spouse name: Not on file  . Number of children: Not on file  . Years of education: Not on file  . Highest education level: Not on file  Occupational History  . Not on file  Tobacco Use  . Smoking status: Never Smoker  . Smokeless tobacco: Never Used  Vaping Use  . Vaping Use: Never used  Substance and Sexual Activity  . Alcohol use: Yes    Comment: occ  . Drug use: Never  . Sexual activity: Not on file  Other Topics Concern  . Not on file  Social History Narrative  . Not on file   Social Determinants of Health   Financial Resource Strain:   . Difficulty of Paying Living Expenses:   Food Insecurity:   . Worried About Programme researcher, broadcasting/film/video in the Last Year:   . Barista in the Last  Year:   Transportation Needs:   . Freight forwarder (Medical):   Marland Kitchen Lack of Transportation (Non-Medical):   Physical Activity:   . Days of Exercise per Week:   . Minutes of Exercise per Session:   Stress:   . Feeling of Stress :   Social Connections:   . Frequency of Communication with Friends and Family:   . Frequency of Social Gatherings with Friends and Family:   . Attends Religious Services:   . Active Member of Clubs or Organizations:   . Attends Banker Meetings:   Marland Kitchen Marital Status:   Intimate Partner Violence:   . Fear of Current or Ex-Partner:   . Emotionally Abused:   Marland Kitchen Physically Abused:   . Sexually Abused:      PHYSICAL EXAM:  VS: BP 112/81   Pulse 96   Ht 6' (1.829 m)   Wt 155 lb (70.3 kg)   BMI 21.02 kg/m  Physical Exam Gen: NAD, alert, cooperative with exam, well-appearing MSK:  Right knee: No obvious effusion. Normal range of motion. No instability with valgus or varus stress testing. Negative McMurray's test. No pain with patellar grind. Negative J sign. Neurovascular intact  Limited ultrasound:  Right knee:  No effusion in the suprapatellar pouch. Normal-appearing quadricep and patellar tendon. Normal-appearing medial joint space and meniscus. Normal-appearing lateral joint space and meniscus. No changes appreciated of the medial or lateral retinaculum  Summary: No structural changes appreciated on exam.  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  Has a history of scoliosis with fusion of the spine when she was 29 years old.  Subluxation of right patella Pain likely associated with subluxation.  Has hypermobility that is likely contributing to her symptoms.  No structural changes appreciated on ultrasound or x-ray. -Counseled on home exercise therapy and supportive care. -Tru-Pull lite brace. -Provided work note. -Could consider physical  therapy or MRI.

## 2019-10-25 ENCOUNTER — Ambulatory Visit (INDEPENDENT_AMBULATORY_CARE_PROVIDER_SITE_OTHER): Admitting: Family Medicine

## 2019-10-25 ENCOUNTER — Encounter: Payer: Self-pay | Admitting: Family Medicine

## 2019-10-25 ENCOUNTER — Other Ambulatory Visit: Payer: Self-pay

## 2019-10-25 VITALS — BP 109/79 | HR 97 | Ht 71.0 in | Wt 150.0 lb

## 2019-10-25 DIAGNOSIS — M222X2 Patellofemoral disorders, left knee: Secondary | ICD-10-CM | POA: Insufficient documentation

## 2019-10-25 DIAGNOSIS — S83001D Unspecified subluxation of right patella, subsequent encounter: Secondary | ICD-10-CM | POA: Diagnosis not present

## 2019-10-25 NOTE — Patient Instructions (Signed)
Good to see you Please try the pennsaid  Please try the rayos as close to 10 pm as you can  Please try the brace   Please send me a message in MyChart with any questions or updates.  Please see [place] back as needed.   --Dr. Jordan Likes

## 2019-10-25 NOTE — Progress Notes (Signed)
Medication Samples have been provided to the patient.  Drug name: Pennsaid       Strength: 2%        Qty: 1 box  LOT: [id number]  Exp.Date: 04/2020  Dosing instructions: use a pea size amount and rub gently.  The patient has been instructed regarding the correct time, dose, and frequency of taking this medication, including desired effects and most common side effects.   Kathi Simpers, MA 1:29 PM 10/25/2019

## 2019-10-25 NOTE — Assessment & Plan Note (Signed)
She does not seem to get some improvement with the brace.  The pain is still occurring with long periods on her feet. -Counseled on home exercise therapy and supportive care. -Would consider MRI.

## 2019-10-25 NOTE — Progress Notes (Signed)
Kristie Vasquez - 29 y.o. female MRN [id number]  Date of birth: February 06, 1991  SUBJECTIVE:  Including CC & ROS.  Chief Complaint  Patient presents with  . Follow-up    right knee  . Knee Pain    left    Kristie Vasquez is a 29 y.o. female that is following up for her knee pain.  She has giving way from time to time.  She still standing for long periods of time.   Review of Systems See HPI   HISTORY: Past Medical, Surgical, Social, and Family History Reviewed & Updated per EMR.   Pertinent Historical Findings include:  Past Medical History:  Diagnosis Date  . Anxiety   . Migraines   . Scoliosis   . Thyroid disease     Past Surgical History:  Procedure Laterality Date  . BACK SURGERY      Family History  Problem Relation Age of Onset  . Arrhythmia Mother   . Arrhythmia Maternal Grandmother     Social History   Socioeconomic History  . Marital status: Married    Spouse name: Not on file  . Number of children: Not on file  . Years of education: Not on file  . Highest education level: Not on file  Occupational History  . Not on file  Tobacco Use  . Smoking status: Never Smoker  . Smokeless tobacco: Never Used  Vaping Use  . Vaping Use: Never used  Substance and Sexual Activity  . Alcohol use: Yes    Comment: occ  . Drug use: Never  . Sexual activity: Not on file  Other Topics Concern  . Not on file  Social History Narrative  . Not on file   Social Determinants of Health   Financial Resource Strain:   . Difficulty of Paying Living Expenses: Not on file  Food Insecurity:   . Worried About Programme researcher, broadcasting/film/video in the Last Year: Not on file  . Ran Out of Food in the Last Year: Not on file  Transportation Needs:   . Lack of Transportation (Medical): Not on file  . Lack of Transportation (Non-Medical): Not on file  Physical Activity:   . Days of Exercise per Week: Not on file  . Minutes of Exercise per Session: Not on file  Stress:   . Feeling of Stress : Not  on file  Social Connections:   . Frequency of Communication with Friends and Family: Not on file  . Frequency of Social Gatherings with Friends and Family: Not on file  . Attends Religious Services: Not on file  . Active Member of Clubs or Organizations: Not on file  . Attends Banker Meetings: Not on file  . Marital Status: Not on file  Intimate Partner Violence:   . Fear of Current or Ex-Partner: Not on file  . Emotionally Abused: Not on file  . Physically Abused: Not on file  . Sexually Abused: Not on file     PHYSICAL EXAM:  VS: BP 109/79   Pulse 97   Ht 5\' 11"  (1.803 m)   Wt 150 lb (68 kg)   BMI 20.92 kg/m  Physical Exam Gen: NAD, alert, cooperative with exam, well-appearing     ASSESSMENT & PLAN:   Patellofemoral pain syndrome of left knee Having pain similar to the right.  May have occasional subluxation with the anterior knee pain.  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   -Tru lite brace. -provided rayos and Pennsaid sample -  Would consider MRI with her ongoing issues.  Could consider steroid injection after getting MRI.  Subluxation of right patella She does not seem to get some improvement with the brace.  The pain is still occurring with long periods on her feet. -Counseled on home exercise therapy and supportive care. -Would consider MRI.

## 2019-10-25 NOTE — Assessment & Plan Note (Addendum)
Having pain similar to the right.  May have occasional subluxation with the anterior knee pain.  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   -Tru lite brace. -provided rayos and Pennsaid sample -Would consider MRI with her ongoing issues.  Could consider steroid injection after getting MRI.

## 2019-10-25 NOTE — Progress Notes (Signed)
Medication Samples have been provided to the patient.  Drug name: Rayos       Strength: 5mg         Qty: 1 box  LOT: E  Exp.Date: 12/2019  Dosing instructions: take 1 tablet by mouth as close to 10 pm as possible.  The patient has been instructed regarding the correct time, dose, and frequency of taking this medication, including desired effects and most common side effects.   01/2020, MA 1:30 PM 10/25/2019

## 2021-02-20 IMAGING — CR DG KNEE COMPLETE 4+V*R*
4 series · 4 of 4 positions shown · non-contrast
Comparison: None.

CLINICAL DATA: Popping sensation 2 months ago with persistent pain
and swelling, initial encounter

EXAM:
RIGHT KNEE - COMPLETE 4+ VIEW

[t knee ap right]
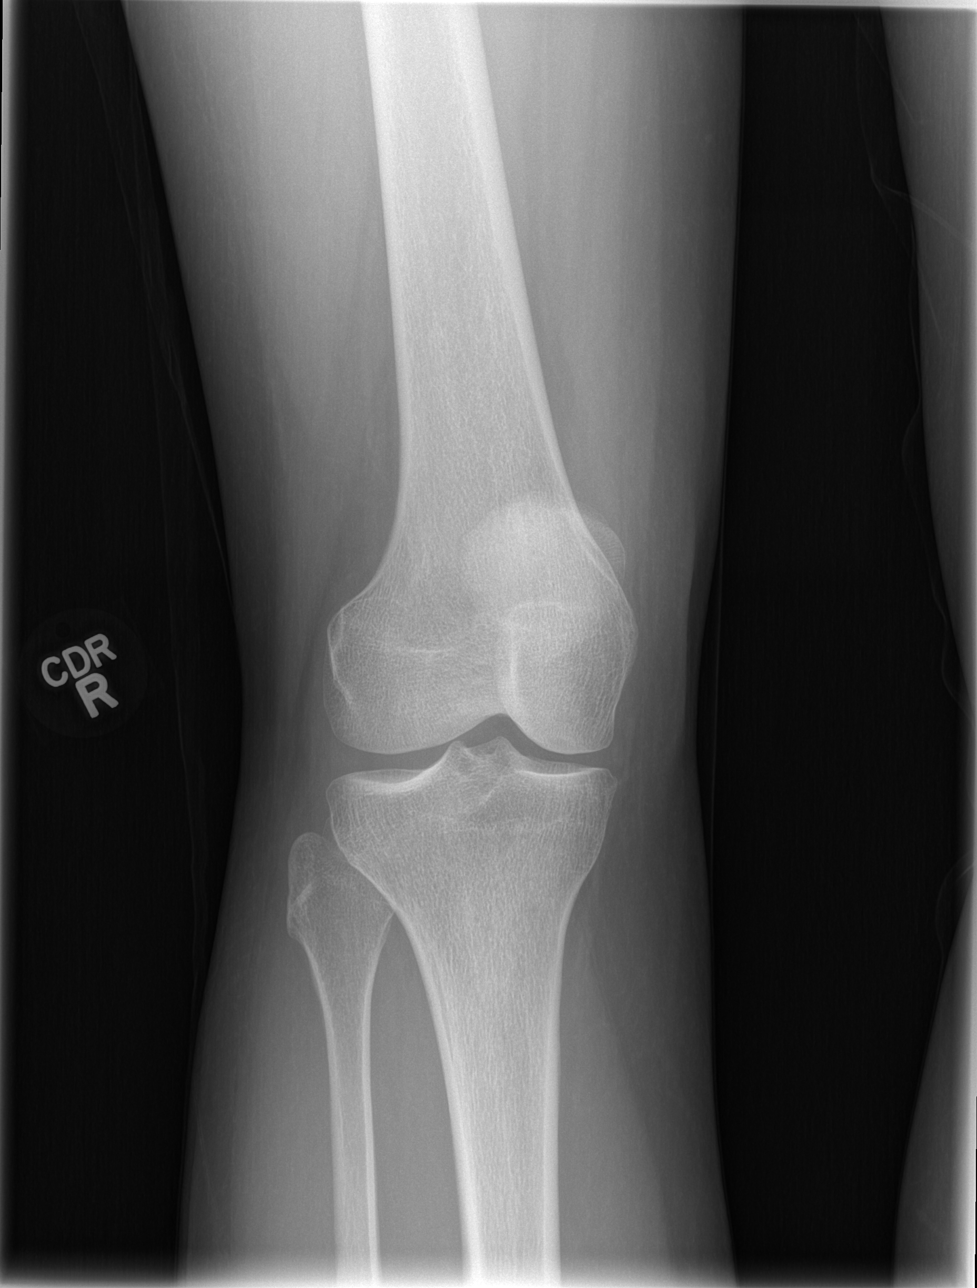

[t knee oblique right (1 of 2)]
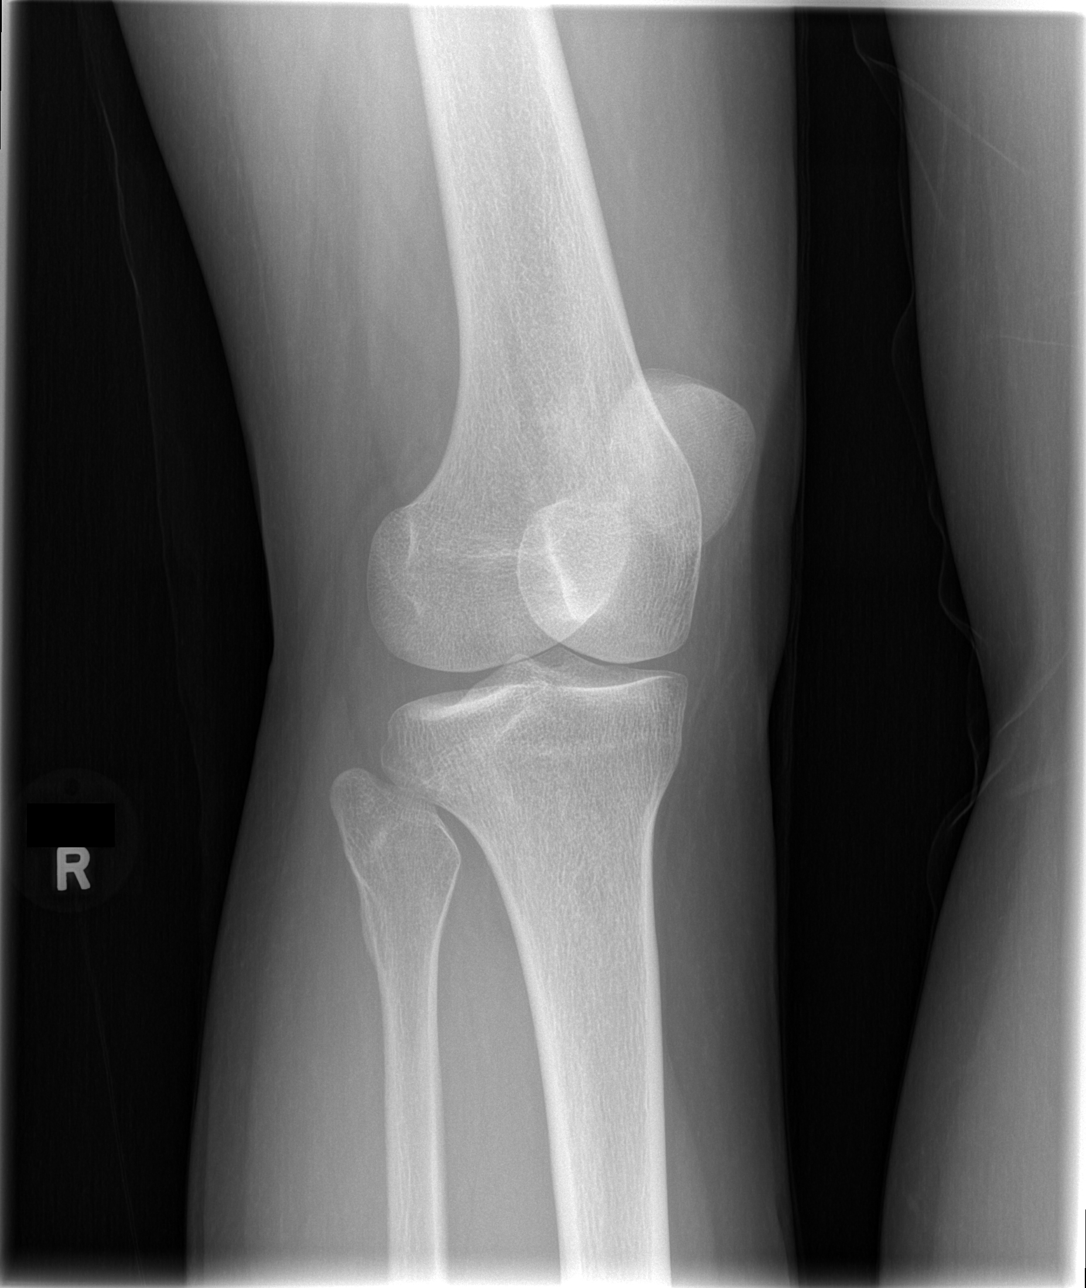

[t knee oblique right (2 of 2)]
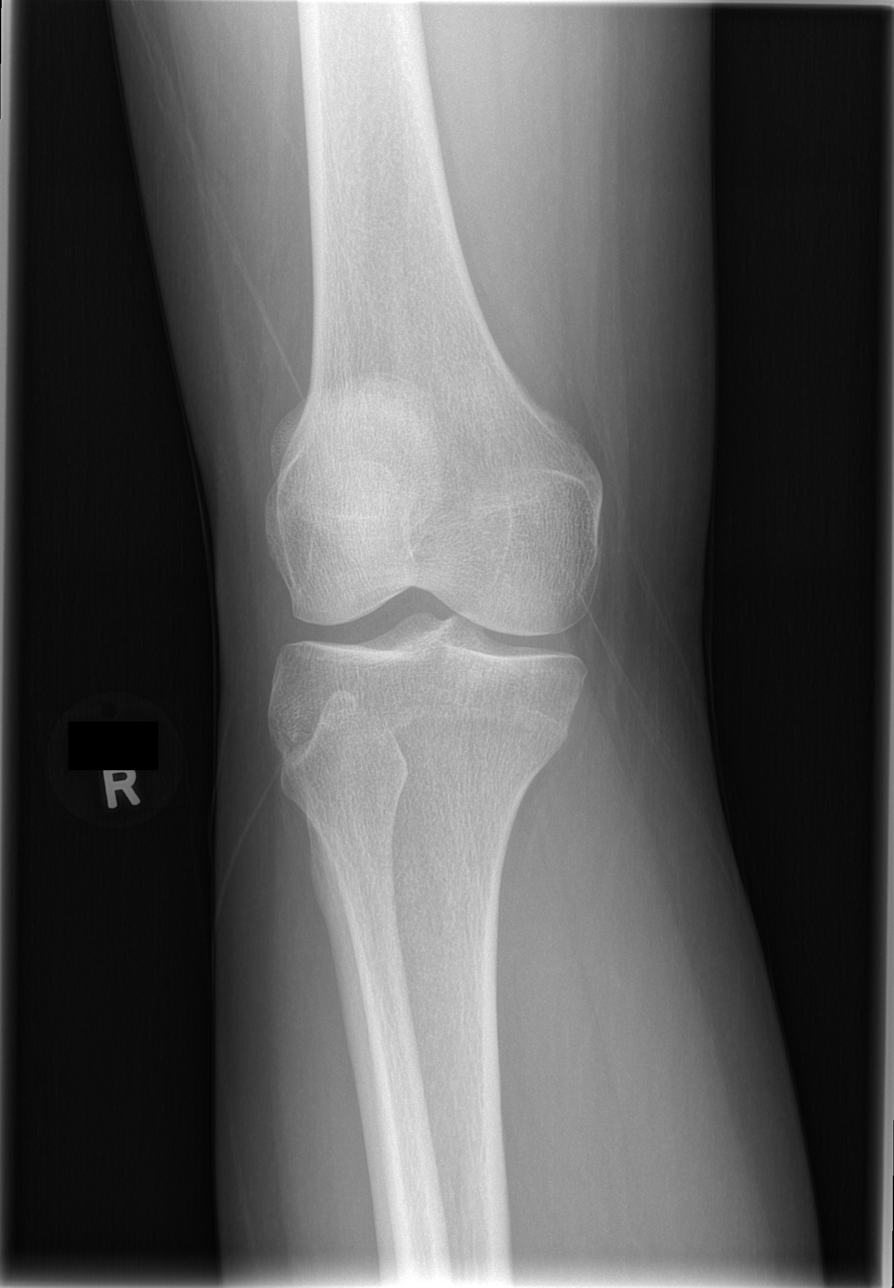

[t knee lat right]
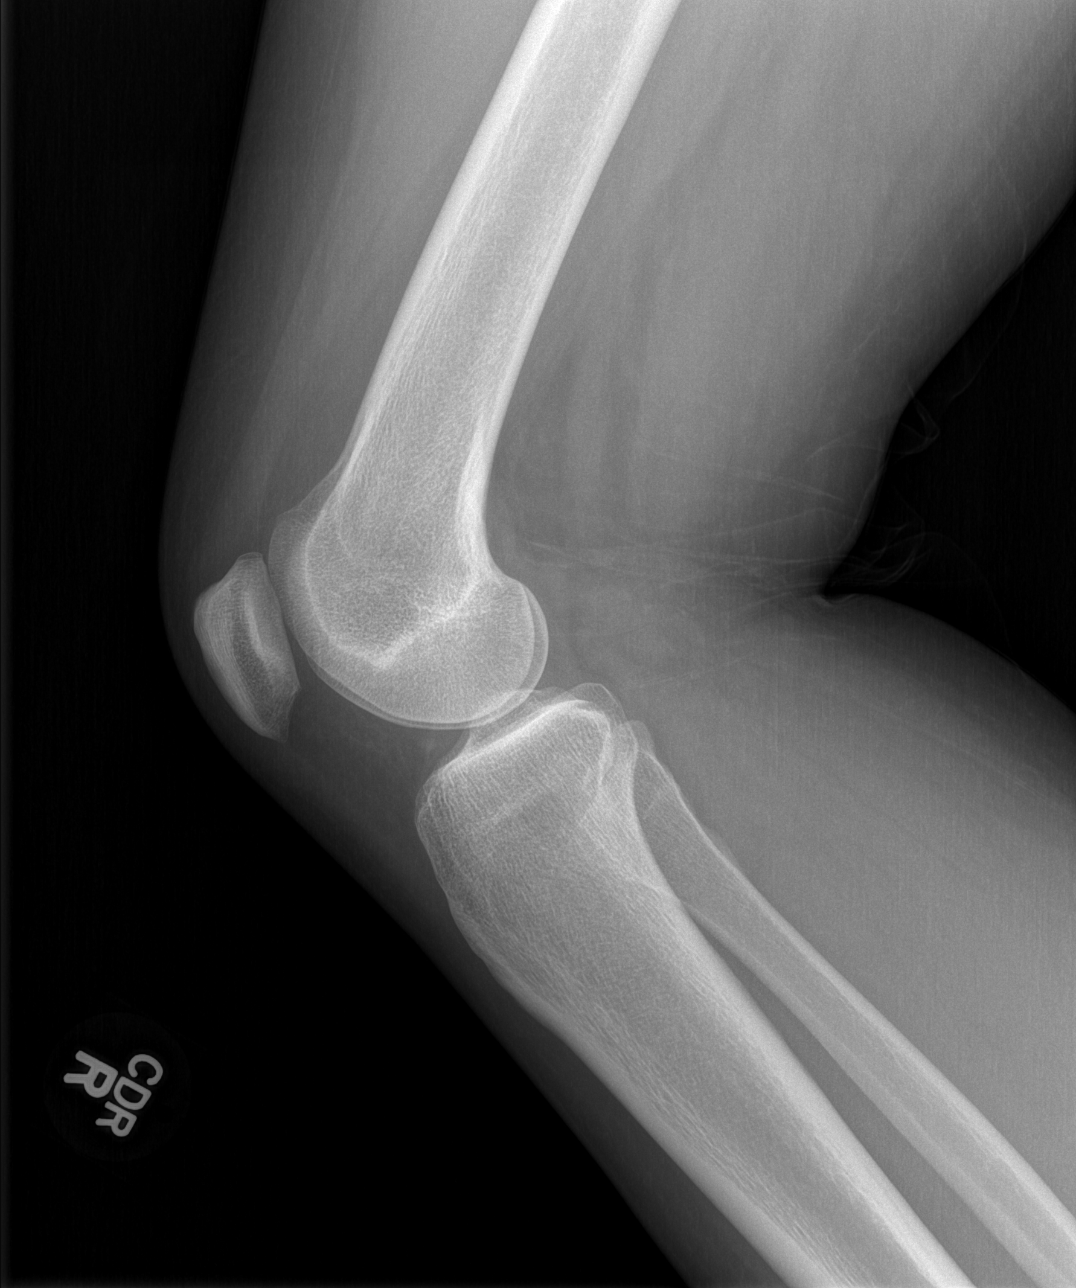

[4 of 4 positions shown; findings below may reference images not displayed]

FINDINGS: No evidence of fracture, dislocation, or joint effusion. No evidence
of arthropathy or other focal bone abnormality. Soft tissues are
unremarkable.
IMPRESSION: No acute abnormality is noted.

## 2022-05-31 ENCOUNTER — Encounter: Payer: Self-pay | Admitting: *Deleted
